# Patient Record
Sex: Male | Born: 1975 | Race: Black or African American | Hispanic: No | Marital: Married | Smoking: Current every day smoker
Health system: Southern US, Community
[De-identification: ages and names within clinical notes are randomized; demographics above are authoritative.]

## PROBLEM LIST (undated history)

## (undated) DIAGNOSIS — Z789 Other specified health status: Secondary | ICD-10-CM

---

## 2001-09-20 ENCOUNTER — Encounter: Payer: Self-pay | Admitting: Urology

## 2001-09-20 ENCOUNTER — Ambulatory Visit (HOSPITAL_COMMUNITY): Admission: RE | Admit: 2001-09-20 | Discharge: 2001-09-20 | Payer: Self-pay | Admitting: Urology

## 2006-04-19 HISTORY — PX: SHOULDER SURGERY: SHX246

## 2007-01-23 ENCOUNTER — Ambulatory Visit: Payer: Self-pay | Admitting: Orthopedic Surgery

## 2007-02-01 ENCOUNTER — Ambulatory Visit (HOSPITAL_COMMUNITY): Admission: RE | Admit: 2007-02-01 | Discharge: 2007-02-01 | Payer: Self-pay | Admitting: Orthopedic Surgery

## 2007-02-15 ENCOUNTER — Ambulatory Visit: Payer: Self-pay | Admitting: Orthopedic Surgery

## 2007-02-15 DIAGNOSIS — M24419 Recurrent dislocation, unspecified shoulder: Secondary | ICD-10-CM | POA: Insufficient documentation

## 2007-02-22 ENCOUNTER — Encounter: Payer: Self-pay | Admitting: Orthopedic Surgery

## 2007-03-13 ENCOUNTER — Ambulatory Visit: Payer: Self-pay | Admitting: Orthopedic Surgery

## 2007-03-24 ENCOUNTER — Emergency Department (HOSPITAL_COMMUNITY): Admission: EM | Admit: 2007-03-24 | Discharge: 2007-03-24 | Payer: Self-pay | Admitting: Emergency Medicine

## 2007-03-24 ENCOUNTER — Ambulatory Visit (HOSPITAL_COMMUNITY): Admission: RE | Admit: 2007-03-24 | Discharge: 2007-03-24 | Payer: Self-pay | Admitting: Orthopedic Surgery

## 2007-03-24 ENCOUNTER — Ambulatory Visit: Payer: Self-pay | Admitting: Orthopedic Surgery

## 2007-03-28 ENCOUNTER — Ambulatory Visit: Payer: Self-pay | Admitting: Orthopedic Surgery

## 2007-03-28 DIAGNOSIS — M24819 Other specific joint derangements of unspecified shoulder, not elsewhere classified: Secondary | ICD-10-CM | POA: Insufficient documentation

## 2007-04-05 ENCOUNTER — Ambulatory Visit: Payer: Self-pay | Admitting: Orthopedic Surgery

## 2007-05-15 ENCOUNTER — Encounter: Payer: Self-pay | Admitting: Orthopedic Surgery

## 2007-05-17 ENCOUNTER — Ambulatory Visit: Payer: Self-pay | Admitting: Orthopedic Surgery

## 2007-07-10 ENCOUNTER — Ambulatory Visit: Payer: Self-pay | Admitting: Orthopedic Surgery

## 2007-07-18 ENCOUNTER — Telehealth: Payer: Self-pay | Admitting: Orthopedic Surgery

## 2007-08-01 ENCOUNTER — Ambulatory Visit: Payer: Self-pay | Admitting: Orthopedic Surgery

## 2007-08-29 ENCOUNTER — Ambulatory Visit: Payer: Self-pay | Admitting: Orthopedic Surgery

## 2007-09-18 ENCOUNTER — Encounter: Payer: Self-pay | Admitting: Orthopedic Surgery

## 2007-09-25 ENCOUNTER — Encounter: Admission: RE | Admit: 2007-09-25 | Discharge: 2007-09-25 | Payer: Self-pay | Admitting: Orthopedic Surgery

## 2007-09-27 ENCOUNTER — Encounter: Payer: Self-pay | Admitting: Orthopedic Surgery

## 2007-10-03 ENCOUNTER — Ambulatory Visit: Payer: Self-pay | Admitting: Orthopedic Surgery

## 2007-12-15 ENCOUNTER — Encounter: Payer: Self-pay | Admitting: Orthopedic Surgery

## 2008-01-23 ENCOUNTER — Telehealth: Payer: Self-pay | Admitting: Orthopedic Surgery

## 2008-01-24 ENCOUNTER — Encounter: Payer: Self-pay | Admitting: Orthopedic Surgery

## 2008-02-13 ENCOUNTER — Telehealth: Payer: Self-pay | Admitting: Orthopedic Surgery

## 2008-02-15 ENCOUNTER — Encounter: Payer: Self-pay | Admitting: Orthopedic Surgery

## 2008-03-06 ENCOUNTER — Encounter: Payer: Self-pay | Admitting: Orthopedic Surgery

## 2008-04-05 ENCOUNTER — Encounter: Payer: Self-pay | Admitting: Orthopedic Surgery

## 2008-06-26 ENCOUNTER — Encounter: Payer: Self-pay | Admitting: Orthopedic Surgery

## 2009-02-23 IMAGING — CT CT EXTREM UP W/O CM*L*
2 of 3 series · 7 of 14 positions shown, 8 images · non-contrast
Comparison: 02/01/2007

CLINICAL DATA: Shoulder instability.  Hill-Sachs impaction and
Irimia tear.  Labral anchors.

CT OF THE LEFT SHOULDER WITHOUT CONTRAST
TECHNIQUE: Multidetector CT imaging of the left shoulder was
performed according to the standard protocol without intravenous
contrast. Multiplanar CT image reconstructions were also generated.

[Series 3: shoulder/standard · axial · 0.51mm/px · z∈[-168,-34]mm · 5 of 161 slices shown]
[im 27/161  soft-tissue]
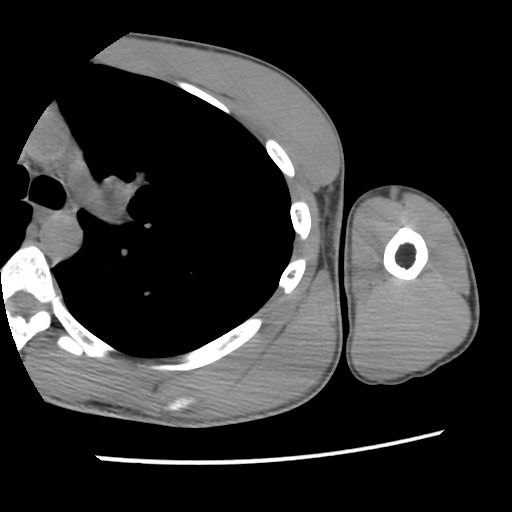
[im 54/161  soft-tissue]
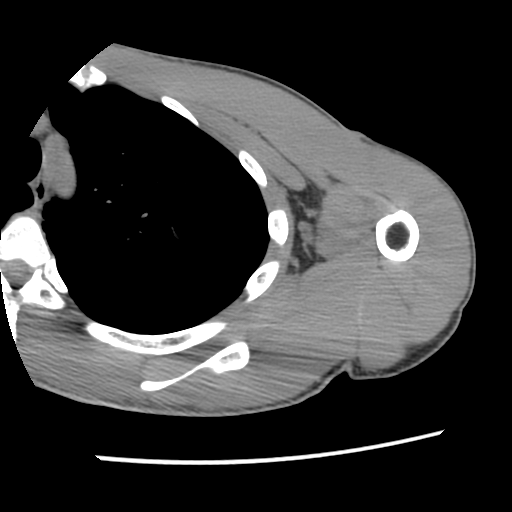
[im 81/161  soft-tissue]
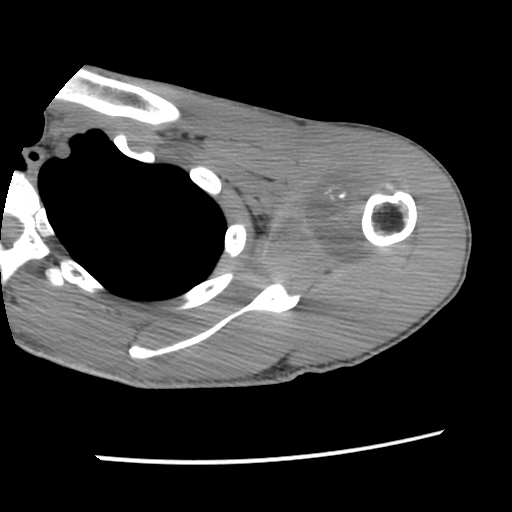
[im 107/161  soft-tissue]
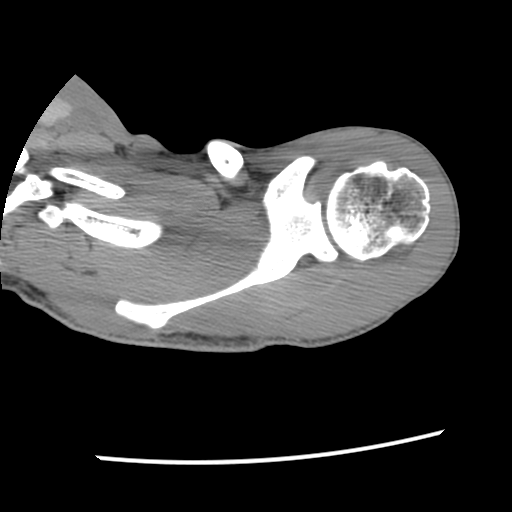
[im 134/161  soft-tissue]
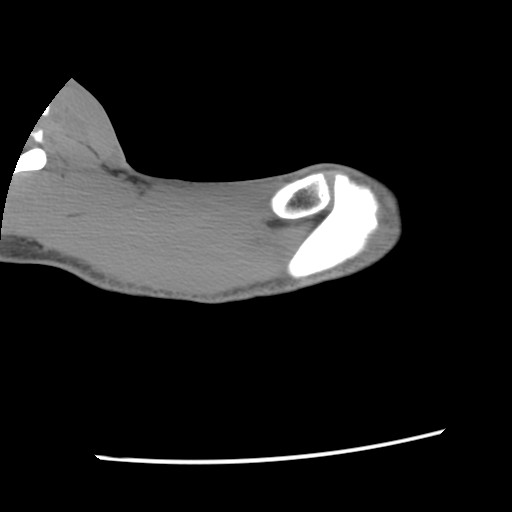

[Series 401: sag obl · sagittal · 0.51mm/px · 2 of 111 slices shown, 3 images]
[im 37/111  soft-tissue]
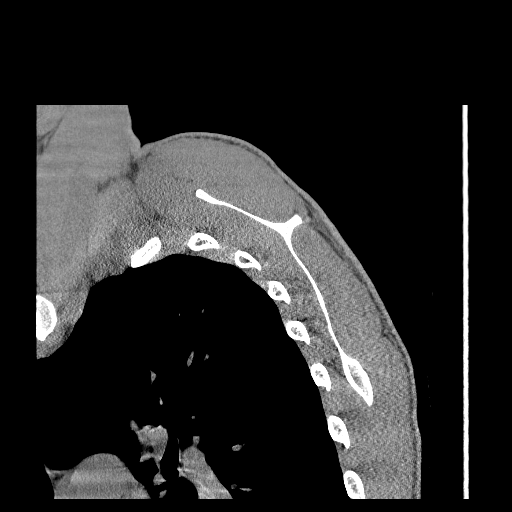
[im 37/111  bone]
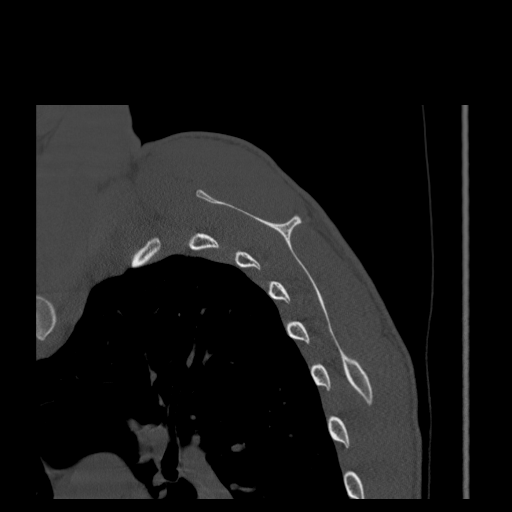
[im 74/111  bone]
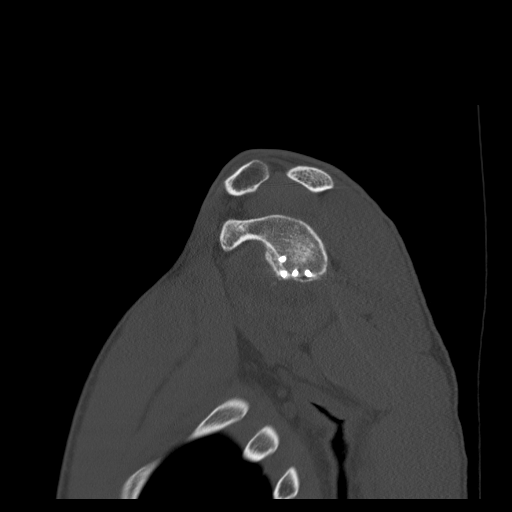

[7 of 14 positions shown; findings below may reference images not displayed]

FINDINGS: The anterior and anterior inferior bony labrum appear
irregular.  On the sagittal images such as images 70 through 77 of
series 401, this appears to represent a prior anterior glenoid
fracture with some evidence of healing response.  This healing
response has resulted in significant anterior spurring.

Four metallic anchors are noted within the anterior and anterior
inferior bony labrum.  In addition, there are several small linear
densities dependently within the shoulder joint effusion.  These do
not have the metallic density of the bony glenoid anchors, but
appear to have a density more comparable to that of cortical bone.
This could represent cortical bone fragments or calcified
bioabsorbable anchor fragments.

Additional small calcific densities are present within the shoulder
joint, and the possibility of synovial osteochondromatosis is not
excluded. There is a considerable sized Hill-Sachs impaction of the
posterior superior humeral head.  There are screw tracks from prior
bioabsorbable screws along the greater tuberosity.

The acromial undersurface is type 3 (hooked).
IMPRESSION: 1.  Prior anterior glenoid fracture, which appears healed, with
significant residual spurring.  There are metallic screw anchors in
the inferior and anterior-inferior glenoid. The anterior bony
glenoid is irregular.
2.  Several linear calcified fragments free in the shoulder joint
effusion are present.  There are also a smaller lace-like
calcifications along the margin of the joint effusion.  The
appearance could reflect synovial osteochondromatosis and/or
potentially calcified bioabsorbable loose screws.
3.  Prominent Hill-Sachs impaction of the humeral head posteriorly.
4.  Hooked subacromial morphology.

## 2009-06-10 ENCOUNTER — Encounter: Payer: Self-pay | Admitting: Orthopedic Surgery

## 2010-05-21 NOTE — Letter (Signed)
Summary: Medical record request [facility]  Medical record request [facility]   Imported By: Cammie Sickle 10/11/2009 13:25:43  _____________________________________________________________________  External Attachment:    Type:   Image     Comment:   External Document

## 2010-09-01 NOTE — Op Note (Signed)
NAMEMANLY, NESTLE NO.:  [id number]   MEDICAL RECORD NO.:  [id number]          PATIENT TYPE:  AMB   LOCATION:  DAY                           FACILITY:  [facility]   PHYSICIAN:  Vickki Hearing, M.D.DATE OF BIRTH:  [birth year]   DATE OF PROCEDURE:  03/24/2007  DATE OF DISCHARGE:                               OPERATIVE REPORT   PREOP DIAGNOSIS:  Recurrent dislocation left shoulder.   POSTOP DIAGNOSIS:  Recurrent dislocation left shoulder.   PROCEDURE:  1. Examination under anesthesia.  2. Bankart repair, left shoulder.   SURGEON:  Fuller Canada, MD assisted by  [place].   ANESTHETIC:  General.   OPERATIVE FINDINGS:  The shoulder had a grade 1 laxity in the anterior  plane, none in the posterior plane, none inferiorly under anesthesia.  Intraop evaluation revealed stripping of the anterior labrum, Hill-  Sach's impaction lesion is noted on MRI, no cuff tear was noted.   PROCEDURE AS FOLLOWS:  Firman Iwai identified preop, left shoulder  marked for surgery, countersigned by surgeon.  Patient went to surgery,  had general anesthetic, IV Ancef was given.  Sterile prep and drape was  performed.  Exam under anesthesia was done prior to sterile prep and  drape.  Findings are noted in the operative report above.   After draping was completed, a timeout procedure was done.   Subcu tissue was injected with Marcaine, epinephrine solution.  Incision  was made using the previous incision.  There was significant scarring.  Careful dissection was taken down to the deltopectoral interval.  The  cephalic vein was retracted laterally and preserved.  Deltopectoral  interval was entered.  The strap muscles were adhesed to the underlying  fascia.  This was dissected with peanuts and blunt and sharp dissection.  We took down the strap muscles on the lateral portion and tagged it.  We  externally rotated the arm, marked the biceps tendon with a suture.  The  interval  between the capsule and the subscapularis muscle were  indistinguishable.  I tried several methods to find this interval.  We  first tried a medial approach to lateral approach, could not separate  the capsule from the tendon, tried a superior inferior approach, could  not do it as well.   I, therefore, released the subscapularis and capsule in one layer,  reflected this medially and examined the joint.  We irrigated the joint.  There was a loss of a bumper effect of the anterior labrum and this was  treated with takedown of the capsule towards the scapula.  Then two  suture anchors were placed, one at 6 o'clock and one at 8 o'clock, and  then an inside to outside suture was used to recreate the bumper effect  on the glenoid.  We used two Arthrex corkscrew anchors with a 35  corkscrew size.  We then repaired this down and closed the subscap  capsular layer.  The inferior capsule was not redundant and there was no  pouch noted.   We repaired the subscap anatomically with interrupted #2 Ethibond  suture.  We then  repaired the strap muscle with a #1 Vicryl, injected  more epinephrine, Sensorcaine solution in the submuscular layers but not  in the joint.  We put in a pain pump catheter, #2-0 Monocryl and #3-0  Prolene and Steri-Strips to close the skin.   Pain pump catheter was activated.  Sterile bandage was applied with  sling.   POSTOP PLAN:  Because this was a revision he is going to stay in a sling  for 3 weeks.  Of note, he did have 25 degrees of external rotation at  the end of the procedure and a shuck test was performed to assure  stability of the shoulder anteriorly and this was stable.  He will be  discharged with pain medication, Phenergan, the pain pump catheter and a  followup will be scheduled for next week.      Vickki Hearing, M.D.  Electronically Signed     SEH/MEDQ  D:  03/24/2007  T:  03/24/2007  Job:  [id number]

## 2010-09-01 NOTE — H&P (Signed)
[name], [name]               ACCOUNT NO.:  [id number]   MEDICAL RECORD NO.:  [id number]          PATIENT TYPE:  AMB   LOCATION:  DAY                           FACILITY:  [facility]   PHYSICIAN:  Vickki Hearing, M.D.DATE OF BIRTH:  30-Jan-1976   DATE OF ADMISSION:  DATE OF DISCHARGE:  LH                              HISTORY & PHYSICAL   CHIEF COMPLAINT:  Instability of the left shoulder.   MEDICAL HISTORY:  Brett Mcintyre is now 35 years old.  He is status post an  anterior shoulder reconstruction on the left in 2001.  He did well for 7  years.  Approximately 2 weeks ago he fell.  His shoulder popped out of  place, he put it back in, it came out again, he went to the emergency  room.  On that occasion it was a subluxation.  He now complains of 6/10  constant, aching, sharp pain which is nonradiating, located over the  left shoulder joint.   He has undergone some physical therapy to improve his shoulder motion.  He is currently managed with 5 mg of Vicodin one every 4 hours p.r.n.  for pain.  He presents now for a shoulder reconstruction for the second  time.  He had an MRI done February 01, 2007, at [facility].  He  has a large Hill-Sachs impaction injury of the humeral head.  His  anterior labrum has been stripped and is most likely a periosteal sleeve  avulsion.  There is tearing of the posterior labrum.  There is an  articular surface tear of the infraspinatus-supraspinatus junction with  an intact biceps tendon.   He has no current allergies that there are known.  He has no other  surgeries, has a negative family and social history.   He has normal vital signs.  His appearance was he was well-developed and  -nourished, grooming and hygiene were normal, body habitus thin frame.  Skin was intact.  No scars, rashes or lesions.  There is a scar over the  left shoulder which was nontender and healed.  There was no swelling in  the shoulder.  His radial and ulnar pulses were  intact bilaterally.  He  had normal sensation, reflexes and strength.  His flexion in the left  shoulder was 150, abduction 120, external rotation 40 with pain.  He had  a positive apprehension test.  His posterior stress test was negative.  Impingement sign was negative.  He had no inferior subluxation.   IMPRESSION:  Recurrent shoulder dislocation left shoulder.   PLAN:  Scheduled for open Bankart with capsular shift left shoulder.  The patient understands the possibility of loss of external rotation.  Informed consent process was completed in the office.  The patient  understands that no guarantees for re-dislocation are made at this  point.      Vickki Hearing, M.D.  Electronically Signed    SEH/MEDQ  D:  03/23/2007  T:  03/23/2007  Job:  [id number]

## 2011-01-25 LAB — HEMOGLOBIN AND HEMATOCRIT, BLOOD
HCT: 42.5
Hemoglobin: 13.9

## 2011-06-04 ENCOUNTER — Encounter (INDEPENDENT_AMBULATORY_CARE_PROVIDER_SITE_OTHER): Payer: Self-pay | Admitting: General Surgery

## 2011-06-04 ENCOUNTER — Ambulatory Visit (INDEPENDENT_AMBULATORY_CARE_PROVIDER_SITE_OTHER): Payer: 59 | Admitting: General Surgery

## 2011-06-04 VITALS — BP 104/76 | HR 72 | Temp 97.8°F | Resp 16 | Ht 70.0 in | Wt 135.8 lb

## 2011-06-04 DIAGNOSIS — K645 Perianal venous thrombosis: Secondary | ICD-10-CM

## 2011-06-04 MED ORDER — LIDOCAINE HCL 2 % EX GEL: CUTANEOUS | Status: DC

## 2011-06-04 NOTE — Patient Instructions (Signed)
Continue with warm water tub soaks 3-4 times/day for 15-20 minutes at a time  Continue to try to have soft, daily bowel movements. Avoid straining. Avoid sitting on the toilet for a prolonged period of time

## 2011-06-04 NOTE — Progress Notes (Signed)
Subjective:     Patient ID: Brett Mcintyre, male   DOB: [birth year], 36 y.o.   MRN: [id number]  HPI 36 year old African American male sent by urgent care for evaluation of a thrombosed external hemorrhoid. He states that he started having itching 3 days ago with bowel movements. He then developed pain in his rectal region 2 days ago. He noticed an area that was swollen and tender. He states that it does hurt when he has a bowel movement only for a few minutes. He denies constipation or diarrhea. He denies any incontinence. He denies sitting and reading on the commode. He denies straining at defecation. He denies any recent melena or hematochezia. He did have an episode of hematochezia several months ago for which he underwent a colonoscopy. He states that a colonoscopy was negative. He denies any recent heavy lifting or strenuous activity.  PMH, PSH, ALL, MED, SOCH, FAMHx reviewed and documented in medical record  Review of Systems 10 point ROS was performed and all systems are negative except for what is mentioned in HPI    Objective:   Physical Exam BP 104/76  Pulse 72  Temp(Src) 97.8 F (36.6 C) (Temporal)  Resp 16  Ht 5\' 10"  (1.778 m)  Wt 135 lb 12.8 oz (61.598 kg)  BMI 19.49 kg/m2  Gen: alert, NAD, non-toxic appearing Pupils: equal, no scleral icterus Pulm: Lungs clear to auscultation, symmetric chest rise CV: regular rate and rhythm Abd: soft, nontender, nondistended. Ext: no edema,  Skin: no rash, no jaundice Rectal: small thrombosed right lateral ext hemorrhoid. No fissure. dre deferred     Assessment:     Thrombosed right lateral external hemorrhoid    Plan:     We discussed the etiology of hemorrhoids. The patient was given educational material.  We discussed nonoperative and operative management of external hemorrhoidal disease.  We then discussed excisional external hemorrhoidectomy in the office along with the risks and benefits including but not limited to  bleeding, infection, recurrence and postop course  Not sure why pt develop hemorrhoid. He has great bowel habits.   PLAN: the patient decided to continue with non-operative management. I think this is reasonable since his symptoms started 2.5 days ago. I explained that after 2 days I generally recommend non-operative management and not I&D or excising the area since generally the maximal pain has already occurred.   Rx given for lidocaine ointment F/u prn  Mary Sella. Andrey Campanile, MD, FACS General, Bariatric, & Minimally Invasive Surgery [facility], [place]

## 2012-01-12 ENCOUNTER — Encounter: Payer: Self-pay | Admitting: Orthopedic Surgery

## 2012-01-12 ENCOUNTER — Ambulatory Visit (INDEPENDENT_AMBULATORY_CARE_PROVIDER_SITE_OTHER): Payer: Self-pay | Admitting: Orthopedic Surgery

## 2012-01-12 VITALS — BP 108/60 | Ht 70.0 in | Wt 135.0 lb

## 2012-01-12 DIAGNOSIS — S62319A Displaced fracture of base of unspecified metacarpal bone, initial encounter for closed fracture: Secondary | ICD-10-CM

## 2012-01-12 MED ORDER — HYDROCODONE-ACETAMINOPHEN 10-325 MG PO TABS: 1.0000 | ORAL_TABLET | Freq: Four times a day (QID) | ORAL | Status: DC | PRN

## 2012-01-12 NOTE — Progress Notes (Signed)
Patient ID: Brett Mcintyre, male   DOB: March 28, 1976, 36 y.o.   MRN: [id number] Chief Complaint  Patient presents with  . Hand Injury    right hand fracture, DOI 01/05/12    The patient fell and injured his right hand he was [facility] x-rays show a base of the fifth metacarpal fracture.  Date of injury 01/05/2012  Mechanism fall  Symptoms include throbbing 7/10 constant pain over the fracture site relieved partially by Lortab 7.5 mg. Worsened by constant movement. Associated signs swelling  All systems reviewed all systems were normal by patient report  Past Medical History  Diagnosis Date  . Hemorrhoids    Past Surgical History  Procedure Date  . Shoulder surgery 2008    left    The patient's allergies are recorded, the medical and surgical history have been recorded, medications family history and social history have been recorded and all have been reviewed.  BP 108/60  Ht 5\' 10"  (1.778 m)  Wt 135 lb (61.236 kg)  BMI 19.37 kg/m2  Vital signs are stable as recorded  General appearance is normal  The patient is alert and oriented x3  The patient's mood and affect are normal  Gait assessment: Normal   The patient's right hand evaluation reveals swelling and tenderness over the base of the fifth metacarpal but there is no malalignment. He is painful in decreased range of motion at the metacarpophalangeal joints. No instability is detected. Muscle tone is normal skin is intact. As a good pulse and normal temperature there is some swelling. Epitrochlear lymph nodes are normal. He has normal sensation no pathologic reflexes.   Plain films are reviewed x-ray base of the fifth metacarpal no dorsal subluxation  Recommend DonJoy moldable split  X-ray in 6 weeks  Full-time wear splint 3 weeks  Out of work 6 weeks  Last date worked September 17 followup here in 6 weeks  Norco 10 mg one every 4 when necessary for pain #56 2 refills

## 2012-01-12 NOTE — Patient Instructions (Addendum)
Full time Splint x 3 weeks   OOW x 6 weeks

## 2012-01-19 ENCOUNTER — Encounter: Payer: Self-pay | Admitting: Orthopedic Surgery

## 2012-01-19 ENCOUNTER — Ambulatory Visit (INDEPENDENT_AMBULATORY_CARE_PROVIDER_SITE_OTHER): Payer: Self-pay | Admitting: Orthopedic Surgery

## 2012-01-19 VITALS — BP 90/60 | Ht 70.0 in | Wt 135.0 lb

## 2012-01-19 DIAGNOSIS — S62319A Displaced fracture of base of unspecified metacarpal bone, initial encounter for closed fracture: Secondary | ICD-10-CM

## 2012-01-19 NOTE — Progress Notes (Signed)
Patient ID: Brett Mcintyre, male   DOB: Aug 29, 1975, 36 y.o.   MRN: [id number] Chief Complaint  Patient presents with  . Follow-up    recheck small finger right hand    Fracture RIGHT hand 5th metacarpal  The skin somehow got macerated  Recommend Gold Bond powder and remove brace for bathing and shower.  Keep x-ray appointment.

## 2012-01-19 NOTE — Patient Instructions (Addendum)
Gold bond powder   Remove for bathing

## 2012-02-16 ENCOUNTER — Encounter: Payer: Self-pay | Admitting: Orthopedic Surgery

## 2012-02-16 ENCOUNTER — Ambulatory Visit (INDEPENDENT_AMBULATORY_CARE_PROVIDER_SITE_OTHER): Payer: Self-pay

## 2012-02-16 ENCOUNTER — Ambulatory Visit (INDEPENDENT_AMBULATORY_CARE_PROVIDER_SITE_OTHER): Payer: Self-pay | Admitting: Orthopedic Surgery

## 2012-02-16 VITALS — BP 90/60 | Ht 70.0 in | Wt 135.0 lb

## 2012-02-16 DIAGNOSIS — S6290XA Unspecified fracture of unspecified wrist and hand, initial encounter for closed fracture: Secondary | ICD-10-CM

## 2012-02-16 DIAGNOSIS — S62309A Unspecified fracture of unspecified metacarpal bone, initial encounter for closed fracture: Secondary | ICD-10-CM

## 2012-02-16 MED ORDER — HYDROCODONE-ACETAMINOPHEN 10-325 MG PO TABS: 1.0000 | ORAL_TABLET | Freq: Four times a day (QID) | ORAL | Status: DC | PRN

## 2012-02-16 NOTE — Patient Instructions (Addendum)
Use an exercise ball to regain strength   OT [facility]   OOW X 3 WEEKS

## 2012-02-16 NOTE — Progress Notes (Signed)
Patient ID: Brett Mcintyre, male   DOB: Jan 16, 1976, 36 y.o.   MRN: [id number] Chief Complaint  Patient presents with  . Follow-up    follow up and xray right hand, DOI 01/05/12    Fracture base of fifth metacarpal right hand  Six-week x-ray   x-ray shows fracture healing no displacement. The patient's range of motion at the metacarpophalangeal joint is limited. Passive range of motion is about 75. 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

## 2012-03-08 ENCOUNTER — Encounter: Payer: Self-pay | Admitting: Orthopedic Surgery

## 2012-03-08 ENCOUNTER — Ambulatory Visit (INDEPENDENT_AMBULATORY_CARE_PROVIDER_SITE_OTHER): Payer: 59 | Admitting: Orthopedic Surgery

## 2012-03-08 VITALS — Ht 70.0 in | Wt 135.0 lb

## 2012-03-08 DIAGNOSIS — S6290XA Unspecified fracture of unspecified wrist and hand, initial encounter for closed fracture: Secondary | ICD-10-CM

## 2012-03-08 DIAGNOSIS — S62319A Displaced fracture of base of unspecified metacarpal bone, initial encounter for closed fracture: Secondary | ICD-10-CM

## 2012-03-08 DIAGNOSIS — S62309A Unspecified fracture of unspecified metacarpal bone, initial encounter for closed fracture: Secondary | ICD-10-CM

## 2012-03-08 MED ORDER — HYDROCODONE-ACETAMINOPHEN 10-325 MG PO TABS: 1.0000 | ORAL_TABLET | Freq: Four times a day (QID) | ORAL | Status: DC | PRN

## 2012-03-08 NOTE — Patient Instructions (Signed)
Return to work on November 22nd

## 2012-03-08 NOTE — Progress Notes (Signed)
Patient ID: Brett Mcintyre, male   DOB: 07/18/1975, 36 y.o.   MRN: [id number] Chief Complaint  Patient presents with  . Follow-up    3 week recheck on right hand ROM.    1. Hand fracture  HYDROcodone-acetaminophen (NORCO) 10-325 MG per tablet  2. Fracture of metacarpal base, closed      Recheck right hand status post occupational therapy for residual swelling and pain and stiffness status post fifth metacarpal fracture  Full range of motion his been restored. He does have some weakness in grip. No rotatory deformity no angulatory deformity.  Continue therapy, return to work on November 20. Pain medication refilled plus one additional refill for a total of 102 tablets over 4 weeks

## 2013-02-14 ENCOUNTER — Ambulatory Visit: Payer: Self-pay | Admitting: Orthopedic Surgery

## 2016-01-26 ENCOUNTER — Encounter: Payer: Self-pay | Admitting: Orthopedic Surgery

## 2016-01-26 ENCOUNTER — Ambulatory Visit (INDEPENDENT_AMBULATORY_CARE_PROVIDER_SITE_OTHER): Payer: BLUE CROSS/BLUE SHIELD | Admitting: Orthopedic Surgery

## 2016-01-26 ENCOUNTER — Ambulatory Visit (INDEPENDENT_AMBULATORY_CARE_PROVIDER_SITE_OTHER): Payer: BLUE CROSS/BLUE SHIELD

## 2016-01-26 VITALS — BP 101/69 | HR 104 | Ht 67.0 in | Wt 142.0 lb

## 2016-01-26 DIAGNOSIS — M25312 Other instability, left shoulder: Secondary | ICD-10-CM

## 2016-01-26 DIAGNOSIS — M25512 Pain in left shoulder: Secondary | ICD-10-CM

## 2016-01-26 DIAGNOSIS — G8929 Other chronic pain: Secondary | ICD-10-CM

## 2016-01-26 MED ORDER — NABUMETONE 500 MG PO TABS: 500.0000 mg | ORAL_TABLET | Freq: Two times a day (BID) | ORAL | 0 refills | Status: DC

## 2016-01-26 NOTE — Progress Notes (Signed)
Chief Complaint  Patient presents with  . Shoulder Pain    left shoulder pain/ hx of surgeries in past    40 year old male status post LATERJET for recurrent shoulder dislocation on the left. Presents with 1-1/2 month history of pain left shoulder clicking which is constant aching dull associated with some giving way symptoms unrelieved by [facility] powders with no history of acute trauma.   Shoulder Pain   The pain is present in the left shoulder. This is a recurrent problem. The current episode started more than 1 month ago. There has been no history of extremity trauma. The problem occurs constantly. The problem has been gradually worsening. The quality of the pain is described as aching. The pain is at a severity of 6/10. Pertinent negatives include no numbness, stiffness or tingling. He has tried NSAIDS and OTC pain meds for the symptoms.    Review of Systems  Musculoskeletal: Negative for stiffness.  Neurological: Negative for tingling and numbness.  All other systems reviewed and are negative.   Past Medical History:  Diagnosis Date  . Hemorrhoids     Past Surgical History:  Procedure Laterality Date  . SHOULDER SURGERY  2008   left   Family History  Problem Relation Age of Onset  . Diabetes     Social History  Substance Use Topics  . Smoking status: Current Every Day Smoker    Packs/day: 1.00  . Smokeless tobacco: Never Used  . Alcohol use Yes     Comment: OCCASIONAL   No outpatient prescriptions have been marked as taking for the 01/26/16 encounter (Office Visit) with Carole Civil, MD.    BP 101/69   Pulse (!) 104   Ht 5\' 7"  (1.702 m)   Wt 142 lb (64.4 kg)   BMI 22.24 kg/m   Physical Exam  Constitutional: He is oriented to person, place, and time. He appears well-developed and well-nourished. No distress.  Cardiovascular: Normal rate and intact distal pulses.   Neurological: He is alert and oriented to person, place, and time.  Skin: Skin is warm and dry.  No rash noted. He is not diaphoretic. No erythema. No pallor.  Psychiatric: He has a normal mood and affect. His behavior is normal. Judgment and thought content normal.    Ortho Exam Right shoulder full range of motion no tenderness to palpation no swelling on inspection. Stable in abduction external rotation normal rotator cuff strength skin covering dry and intact pulses normal lymph nodes negative in the axilla and sensation is normal in the hand  Left shoulder anterior incision well-healed mild tenderness in the posterior joint line mild pain without apprehension in abduction external rotation without instability on load shift test. Full range of motion. Rotator cuff strength normal. Skin normal. Pulse and sensation normal and the left hand  ASSESSMENT: My personal interpretation of the images:  Today's x-ray shows large chronic Hill-Sachs lesion mild arthritis with inferior humeral spur. Hardware intact bone block intact  Diagnosis shoulder pain with chronic history of instability. Does not have acute instability    PLAN Recommend physical therapy Return in 4 weeks Relafen 500 mg twice a day  Arther Abbott, MD 01/26/2016 10:19 AM  .meds

## 2016-01-30 ENCOUNTER — Ambulatory Visit (HOSPITAL_COMMUNITY): Payer: BLUE CROSS/BLUE SHIELD | Attending: Orthopedic Surgery

## 2016-01-30 DIAGNOSIS — M25512 Pain in left shoulder: Secondary | ICD-10-CM | POA: Diagnosis not present

## 2016-01-30 DIAGNOSIS — R29898 Other symptoms and signs involving the musculoskeletal system: Secondary | ICD-10-CM

## 2016-01-30 DIAGNOSIS — G8929 Other chronic pain: Secondary | ICD-10-CM

## 2016-01-30 NOTE — Therapy (Addendum)
[facility] [address] [place], [place], [address] Phone: [phone]   Fax:  [phone]  Occupational Therapy Evaluation  Patient Details  Name: Brett Mcintyre MRN: [id number] Date of Birth: December 30, 1975 Referring Provider: Arther Abbott, MD  Encounter Date: 01/30/2016      OT End of Session - 01/30/16 1247    Visit Number 1   Number of Visits 3   Date for OT Re-Evaluation 02/29/16   Authorization Type BCBS    OT Start Time 0820   OT Stop Time 0900   OT Time Calculation (min) 40 min   Activity Tolerance Patient tolerated treatment well   Behavior During Therapy [facility] for tasks assessed/performed      Past Medical History:  Diagnosis Date  . Hemorrhoids     Past Surgical History:  Procedure Laterality Date  . SHOULDER SURGERY  2008   left    There were no vitals filed for this visit.      Subjective Assessment - 01/30/16 0823    Subjective  S: It doesn't hurt all the time. Only when I do certain movements.   Pertinent History Patient is a 40 y/o male S/P left shoulder instability and chronic pain which began 1-2 months ago. patient reports that he underwent a LaTERJET procedure in 2009 due to recurrent dislocation. Patient reports no known injury to his left shoulder recently although does remember that his pain began soon after doing 50 push ups. Dr. Aline Mcintyre has referred patient to occupational therapy for evaluation and treatment.   Special Tests FOTO score: 50/100   Patient Stated Goals To decrease pain level   Currently in Pain? 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 Pt reports pain with low external rotation when placing left arm in work jumpsuit (4/10), Completing abduction to 90 degree while picking up tool bag - 5-10# (10/10), and driving with left arm (6/10)     Mobility   Mobility Status Independent     Written Expression   Dominant Hand Right     Vision - History   Baseline Vision No visual deficits     Cognition   Overall Cognitive Status Within Functional Limits for tasks assessed     ROM / Strength   AROM / PROM / Strength AROM;PROM;Strength     Palpation   Palpation comment MIn fascial restrictions in left upper arm, trapezius, and scapularis region.     AROM   Overall AROM Comments Assessed seated. IR/er adducted   AROM Assessment Site Shoulder   Right/Left Shoulder Left   Left Shoulder Flexion 165 Degrees   Left Shoulder ABduction 180 Degrees   Left Shoulder  Internal Rotation 80 Degrees   Left Shoulder External Rotation 90 Degrees     PROM   Overall PROM  Within functional limits for tasks performed     Strength   Overall Strength Comments Assessed seated. IR/er adducted.   Strength Assessment Site Shoulder   Right/Left Shoulder Right   Right Shoulder Flexion 5/5   Right Shoulder ABduction 5/5   Right Shoulder Internal Rotation 5/5   Right Shoulder External Rotation 4+/5                         OT Education - 01/30/16 1245    Education provided Yes   Education Details Theraband strengthening for shoulder and scapula (green)   Person(s) Educated Patient   Methods Explanation;Demonstration;Verbal cues;Handout   Comprehension Verbalized understanding;Returned demonstration           OT Short Term Goals - 01/30/16 1255      OT SHORT TERM GOAL #1   Title Patient will be educated and independent with HEP to increase functional use of LUE during daily and work related tasks.    Time 3   Period Weeks   Status New     OT SHORT TERM GOAL #2   Title Patient will report decreased pain level in LUE when completing tasks such as driving, getting jumpsuit on, and picking up tool bag.   Time 3   Period Weeks   Status New     OT SHORT TERM GOAL #3   Title Patient will increase shoulder and scapular stability in LUE to increase strength reduce the occurance of pain.   Time 3   Period Weeks   Status New                  Plan - 01/30/16 1248    Clinical Impression Statement A: Patient is a 40 y/o male S/P left chronic shoulder pain and instability which is causing increased pain and fascial restrictions and decreased scapular/shoulder stability resulting in difficulty completing daily and work related tasks.   Rehab Potential Excellent   OT Frequency 1x / week   OT Duration Other (comment)  3 weeks   OT Treatment/Interventions Self-care/ADL training;Therapeutic exercise;Patient/family education;Manual Therapy;Ultrasound;Therapeutic activities;DME and/or AE instruction;Cryotherapy;Electrical Stimulation;Moist Heat;Passive range of motion   Plan P: Patient will benefit from skilled OT services to increase functional performance during daily tasks. Treatment Plan: Patient is to complete HEP independently while attending OT 1 time a week for 3 weeks (total of 2 treatment session). Myofascial release, scapular and shoulder stability exercises.       Patient will benefit from skilled therapeutic intervention in order to improve the following deficits and impairments:  Decreased strength, Pain, Increased fascial restricitons  Visit Diagnosis: Chronic left shoulder pain - Plan: Ot plan of care cert/re-cert  Other symptoms and signs involving the  musculoskeletal system - Plan: Ot plan of care cert/re-cert    Problem List Patient Active Problem List   Diagnosis Date Noted  . Fracture of metacarpal base, closed 01/12/2012  . SHOULDER INSTABILITY 03/28/2007  . SHOULDER DISLOCATION-RECURRENT 02/15/2007   Ailene Ravel, OTR/L,CBIS  [phone]  01/30/2016, 1:02 PM  [facility] [address] [place], [place], [address] Phone: [phone]   Fax:  [phone]  Name: Brett Mcintyre MRN: [id number] Date of Birth: [birth year]   OCCUPATIONAL THERAPY DISCHARGE SUMMARY  Visits from Start of Care: 1  Current functional level related to goals / functional outcomes: See above  Remaining deficits: All deficits remain that are listed above.   Education / Equipment: See above Plan: Patient agrees to discharge.  Patient goals were not met. Patient is being discharged due to not returning since the last visit.  ?????         Ailene Ravel, OTR/L,CBIS  [phone]

## 2016-01-30 NOTE — Patient Instructions (Signed)
Strengthening: Chest Pull - Resisted   Hold Theraband in front of body with hands about shoulder width a part. Pull band a part and back together slowly. Repeat _10-15___ times. Complete __1__ set(s) per session.. Repeat __2__ session(s) per day.  http://orth.exer.us/926   Copyright  VHI. All rights reserved.   PNF Strengthening: Resisted   Standing with resistive band around each hand, bring right arm up and away, thumb back. Repeat _10-15___ times per set. Do _1___ sets per session. Do 2____ sessions per day.            Resisted External Rotation: in Neutral - Bilateral   Sit or stand, tubing in both hands, elbows at sides, bent to 90, forearms forward. Pinch shoulder blades together and rotate forearms out. Keep elbows at sides. Repeat _10-15___ times per set. Do __1__ sets per session. Do __2__ sessions per day.  http://orth.exer.us/966   Copyright  VHI. All rights reserved.   PNF Strengthening: Resisted   Standing, hold resistive band above head. Bring right arm down and out from side. Repeat _10-15___ times per set. Do __1__ sets per session. Do __2__ sessions per day.  http://orth.exer.us/922   Copyright  VHI. All rights reserved.   Shoulder Flexion - Theraband  Place one end of the theraband under your foot and one in your hand.  Keeping elbow straight, raise arm straight out in front.  Complete 10-15 times. 1 set. 2 times a day.   SHOULDER FLEXION WITH THERABAND  While standing with back to the door, holding Theraband at hand level, raise arm in front of you.  Keep elbow straight through entire movement.  Complete 10-15 times. 1 set. 2 times a day.   (Home) Extension: Isometric / Bilateral Arm Retraction - Sitting   Facing anchor, hold hands and elbow at shoulder height, with elbow bent.  Pull arms back to squeeze shoulder blades together. Repeat 10-15 times.  Copyright  VHI. All rights reserved.   (Home) Retraction: Row - Bilateral  (Anchor)   Facing anchor, arms reaching forward, pull hands toward stomach, keeping elbows bent and at your sides and pinching shoulder blades together. Repeat 10-15 times.  Copyright  VHI. All rights reserved.   (Clinic) Extension / Flexion (Assist)   Face anchor, pull arms back, keeping elbow straight, and squeze shoulder blades together. Repeat 10-15 times.   Copyright  VHI. All rights reserved.

## 2016-02-06 ENCOUNTER — Ambulatory Visit (HOSPITAL_COMMUNITY): Payer: BLUE CROSS/BLUE SHIELD

## 2016-02-13 ENCOUNTER — Ambulatory Visit (HOSPITAL_COMMUNITY): Payer: BLUE CROSS/BLUE SHIELD

## 2016-02-24 ENCOUNTER — Ambulatory Visit: Payer: BLUE CROSS/BLUE SHIELD | Admitting: Orthopedic Surgery

## 2017-05-09 DIAGNOSIS — Z6820 Body mass index (BMI) 20.0-20.9, adult: Secondary | ICD-10-CM | POA: Diagnosis not present

## 2017-05-09 DIAGNOSIS — Z0000 Encounter for general adult medical examination without abnormal findings: Secondary | ICD-10-CM | POA: Diagnosis not present

## 2017-05-13 DIAGNOSIS — Z6820 Body mass index (BMI) 20.0-20.9, adult: Secondary | ICD-10-CM | POA: Diagnosis not present

## 2017-05-13 DIAGNOSIS — Z0000 Encounter for general adult medical examination without abnormal findings: Secondary | ICD-10-CM | POA: Diagnosis not present

## 2017-11-02 DIAGNOSIS — R251 Tremor, unspecified: Secondary | ICD-10-CM | POA: Diagnosis not present

## 2017-11-02 DIAGNOSIS — R51 Headache: Secondary | ICD-10-CM | POA: Diagnosis not present

## 2017-11-02 DIAGNOSIS — F17200 Nicotine dependence, unspecified, uncomplicated: Secondary | ICD-10-CM | POA: Diagnosis not present

## 2017-11-02 DIAGNOSIS — R479 Unspecified speech disturbances: Secondary | ICD-10-CM | POA: Diagnosis not present

## 2017-11-02 DIAGNOSIS — R55 Syncope and collapse: Secondary | ICD-10-CM | POA: Diagnosis not present

## 2017-11-04 DIAGNOSIS — Z711 Person with feared health complaint in whom no diagnosis is made: Secondary | ICD-10-CM | POA: Diagnosis not present

## 2017-11-04 DIAGNOSIS — Z0130 Encounter for examination of blood pressure without abnormal findings: Secondary | ICD-10-CM | POA: Diagnosis not present

## 2017-11-04 DIAGNOSIS — I959 Hypotension, unspecified: Secondary | ICD-10-CM | POA: Diagnosis not present

## 2017-11-04 DIAGNOSIS — F17200 Nicotine dependence, unspecified, uncomplicated: Secondary | ICD-10-CM | POA: Diagnosis not present

## 2017-12-30 DIAGNOSIS — M5442 Lumbago with sciatica, left side: Secondary | ICD-10-CM | POA: Diagnosis not present

## 2017-12-30 DIAGNOSIS — F17200 Nicotine dependence, unspecified, uncomplicated: Secondary | ICD-10-CM | POA: Diagnosis not present

## 2017-12-30 DIAGNOSIS — M545 Low back pain: Secondary | ICD-10-CM | POA: Diagnosis not present

## 2017-12-30 DIAGNOSIS — M4697 Unspecified inflammatory spondylopathy, lumbosacral region: Secondary | ICD-10-CM | POA: Diagnosis not present

## 2018-01-04 DIAGNOSIS — M62830 Muscle spasm of back: Secondary | ICD-10-CM | POA: Diagnosis not present

## 2018-01-04 DIAGNOSIS — Z6821 Body mass index (BMI) 21.0-21.9, adult: Secondary | ICD-10-CM | POA: Diagnosis not present

## 2018-05-18 DIAGNOSIS — J09X3 Influenza due to identified novel influenza A virus with gastrointestinal manifestations: Secondary | ICD-10-CM | POA: Diagnosis not present

## 2018-05-18 DIAGNOSIS — J40 Bronchitis, not specified as acute or chronic: Secondary | ICD-10-CM | POA: Diagnosis not present

## 2018-05-18 DIAGNOSIS — Z6820 Body mass index (BMI) 20.0-20.9, adult: Secondary | ICD-10-CM | POA: Diagnosis not present

## 2018-09-18 DIAGNOSIS — Z6820 Body mass index (BMI) 20.0-20.9, adult: Secondary | ICD-10-CM | POA: Diagnosis not present

## 2018-09-18 DIAGNOSIS — J40 Bronchitis, not specified as acute or chronic: Secondary | ICD-10-CM | POA: Diagnosis not present

## 2019-04-26 DIAGNOSIS — R05 Cough: Secondary | ICD-10-CM | POA: Diagnosis not present

## 2019-04-26 DIAGNOSIS — J029 Acute pharyngitis, unspecified: Secondary | ICD-10-CM | POA: Diagnosis not present

## 2019-04-26 DIAGNOSIS — Z20822 Contact with and (suspected) exposure to covid-19: Secondary | ICD-10-CM | POA: Diagnosis not present

## 2019-04-26 DIAGNOSIS — R52 Pain, unspecified: Secondary | ICD-10-CM | POA: Diagnosis not present

## 2019-04-30 DIAGNOSIS — R52 Pain, unspecified: Secondary | ICD-10-CM | POA: Diagnosis not present

## 2019-04-30 DIAGNOSIS — U071 COVID-19: Secondary | ICD-10-CM | POA: Diagnosis not present

## 2019-04-30 DIAGNOSIS — R6883 Chills (without fever): Secondary | ICD-10-CM | POA: Diagnosis not present

## 2019-04-30 DIAGNOSIS — Z20822 Contact with and (suspected) exposure to covid-19: Secondary | ICD-10-CM | POA: Diagnosis not present

## 2019-06-18 DIAGNOSIS — Z0000 Encounter for general adult medical examination without abnormal findings: Secondary | ICD-10-CM | POA: Diagnosis not present

## 2019-06-18 DIAGNOSIS — Z6820 Body mass index (BMI) 20.0-20.9, adult: Secondary | ICD-10-CM | POA: Diagnosis not present

## 2019-11-12 DIAGNOSIS — F10229 Alcohol dependence with intoxication, unspecified: Secondary | ICD-10-CM | POA: Diagnosis not present

## 2019-11-12 DIAGNOSIS — Z6820 Body mass index (BMI) 20.0-20.9, adult: Secondary | ICD-10-CM | POA: Diagnosis not present

## 2019-11-12 DIAGNOSIS — I10 Essential (primary) hypertension: Secondary | ICD-10-CM | POA: Diagnosis not present

## 2019-12-04 DIAGNOSIS — S93401A Sprain of unspecified ligament of right ankle, initial encounter: Secondary | ICD-10-CM | POA: Diagnosis not present

## 2019-12-04 DIAGNOSIS — Z6820 Body mass index (BMI) 20.0-20.9, adult: Secondary | ICD-10-CM | POA: Diagnosis not present

## 2019-12-16 DIAGNOSIS — M545 Low back pain: Secondary | ICD-10-CM | POA: Diagnosis not present

## 2020-05-13 DIAGNOSIS — Z20822 Contact with and (suspected) exposure to covid-19: Secondary | ICD-10-CM | POA: Diagnosis not present

## 2020-05-13 DIAGNOSIS — R42 Dizziness and giddiness: Secondary | ICD-10-CM | POA: Diagnosis not present

## 2020-05-13 DIAGNOSIS — R079 Chest pain, unspecified: Secondary | ICD-10-CM | POA: Diagnosis not present

## 2020-05-21 DIAGNOSIS — Z6821 Body mass index (BMI) 21.0-21.9, adult: Secondary | ICD-10-CM | POA: Diagnosis not present

## 2020-05-21 DIAGNOSIS — G4452 New daily persistent headache (NDPH): Secondary | ICD-10-CM | POA: Diagnosis not present

## 2021-02-02 ENCOUNTER — Encounter: Payer: Self-pay | Admitting: Internal Medicine

## 2021-02-17 ENCOUNTER — Encounter: Payer: Self-pay | Admitting: *Deleted

## 2021-02-17 ENCOUNTER — Other Ambulatory Visit: Payer: Self-pay

## 2021-02-17 ENCOUNTER — Ambulatory Visit (INDEPENDENT_AMBULATORY_CARE_PROVIDER_SITE_OTHER): Payer: BLUE CROSS/BLUE SHIELD | Admitting: *Deleted

## 2021-02-17 VITALS — Ht 70.0 in | Wt 149.0 lb

## 2021-02-17 DIAGNOSIS — Z1211 Encounter for screening for malignant neoplasm of colon: Secondary | ICD-10-CM

## 2021-02-17 MED ORDER — NA SULFATE-K SULFATE-MG SULF 17.5-3.13-1.6 GM/177ML PO SOLN: 1.0000 | Freq: Once | ORAL | 0 refills | Status: AC

## 2021-02-17 NOTE — Progress Notes (Addendum)
Gastroenterology Pre-Procedure Review  Request Date: 02/17/2021 Requesting Physician: Dr. Sherrie Sport, Last TCS 05/22/2010 by Dr. Rowe Pavy, normal colon  PATIENT REVIEW QUESTIONS: The patient responded to the following health history questions as indicated:    1. Diabetes Melitis: no 2. Joint replacements in the past 12 months: no 3. Major health problems in the past 3 months: no 4. Has an artificial valve or MVP: no 5. Has a defibrillator: no 6. Has been advised in past to take antibiotics in advance of a procedure like teeth cleaning: no 7. Family history of colon cancer: no 8. Alcohol Use: yes, 3 shots daily 9. Illicit drug Use: no 10. History of sleep apnea: no 11. History of coronary artery or other vascular stents placed within the last 12 months: no 12. History of any prior anesthesia complications: no 13. Body mass index is 21.38 kg/m.    MEDICATIONS & ALLERGIES:    Patient reports the following regarding taking any blood thinners:   Plavix? no Aspirin? no Coumadin? no Brilinta? no Xarelto? no Eliquis? no Pradaxa? no Savaysa? no Effient? no  Patient confirms/reports the following medications:  No current outpatient medications on file.   No current facility-administered medications for this visit.    Patient confirms/reports the following allergies:  No Known Allergies  No orders of the defined types were placed in this encounter.   AUTHORIZATION INFORMATION Primary Insurance: [employer],  [place] #: [id number],  Group #: [id number] Pre-Cert / Josem Kaufmann required: No, not required per Bernestine Amass / Auth #: REF#: [id number]  SCHEDULE INFORMATION: Procedure has been scheduled as follows:  Date: 03/10/2021, Time: 1:30  Location: [facility] with Dr. Abbey Chatters  This Gastroenterology Pre-Precedure Review Form is being routed to the following provider(s): Neil Crouch, PA-C

## 2021-02-18 NOTE — Progress Notes (Addendum)
Received colonoscopy report.  Last TCS done 05/22/2010 by Dr. Rowe Pavy with normal findings.  Updated triage accordingly and routing to Neil Crouch, PA-C.    Called pt.  He said that he thought procedure was done 2 to 3 years ago but must have been mistaken.  The last one he had was due to rectal bleeding.  Placed records in [place] office for review.

## 2021-02-18 NOTE — Progress Notes (Signed)
What is indication for colonoscopy now? Do we have record from colonoscopy 2-3 years ago? Not sure that he is due based on information provided.

## 2021-02-19 ENCOUNTER — Ambulatory Visit: Payer: BC Managed Care – PPO

## 2021-02-19 ENCOUNTER — Other Ambulatory Visit: Payer: Self-pay

## 2021-02-19 ENCOUNTER — Ambulatory Visit (INDEPENDENT_AMBULATORY_CARE_PROVIDER_SITE_OTHER): Payer: BC Managed Care – PPO | Admitting: Orthopedic Surgery

## 2021-02-19 ENCOUNTER — Encounter: Payer: Self-pay | Admitting: Orthopedic Surgery

## 2021-02-19 VITALS — BP 142/84 | HR 87 | Ht 70.0 in | Wt 147.0 lb

## 2021-02-19 DIAGNOSIS — M7712 Lateral epicondylitis, left elbow: Secondary | ICD-10-CM | POA: Diagnosis not present

## 2021-02-19 DIAGNOSIS — M25522 Pain in left elbow: Secondary | ICD-10-CM

## 2021-02-19 MED ORDER — PREDNISONE 10 MG PO TABS
10.0000 mg | ORAL_TABLET | Freq: Three times a day (TID) | ORAL | 0 refills | Status: DC
Start: 2021-02-19 — End: 2021-03-10

## 2021-02-19 NOTE — Progress Notes (Signed)
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    Appearance: He is well-developed.     Comments: Well developed, well nourished Normal grooming and hygiene     Cardiovascular:     Comments: No peripheral edema Musculoskeletal:     Left elbow: No swelling, deformity, effusion or lacerations. Normal range of motion. Tenderness present in lateral epicondyle.     Comments: + wr ext test   Elbow is stable   Skin:    General: Skin is warm and dry.  Neurological:     Mental Status: He is alert and oriented to person, place, and time.     Sensory: No sensory deficit.     Coordination: Coordination normal.     Gait: Gait normal.     Deep Tendon Reflexes: Reflexes are normal and symmetric.  Psychiatric:        Mood and Affect: Mood normal.        Behavior: Behavior normal.        Thought Content: Thought content normal.        Judgment: Judgment normal.     Comments: Affect normal     Xrays - neg   Encounter Diagnosis  Name Primary?   Pain in left elbow Yes    Nsaids Inject Wrist brace Exercises Cryotherapy Topical aspercreme   Procedure note injection for left tennis elbow  Diagnosis left tennis elbow  Anesthesia ethyl chloride was used Alcohol use is clean the skin  After we obtained verbal consent and  timeout a 25-gauge needle was used to inject 40 mg of Depo-Medrol and 3 cc of 1% lidocaine just distal to the insertion of the ECRB  There were no complications and a sterile bandage was applied.   Meds ordered this encounter  Medications   predniSONE (DELTASONE) 10 MG tablet    Sig: Take 1 tablet (10 mg total) by mouth 3 (three) times daily.    Dispense:  42 tablet    Refill:  0

## 2021-02-19 NOTE — Patient Instructions (Signed)
Apply ice 30 min end of the day   Wear wrist brace x 6 weeks  Take prednisone for 2 weeks   Do exercises daily   Apply aspercreme 3 x a day

## 2021-02-20 NOTE — Progress Notes (Signed)
Noted. OK to schedule. ASAII.

## 2021-02-26 ENCOUNTER — Other Ambulatory Visit: Payer: Self-pay | Admitting: *Deleted

## 2021-02-26 NOTE — Progress Notes (Signed)
Spoke to [employer] at [employer].  She informed me that pt can have colonoscopies starting at age 46.  REF#: [id number]

## 2021-03-10 ENCOUNTER — Ambulatory Visit (HOSPITAL_COMMUNITY): Payer: BC Managed Care – PPO | Admitting: Anesthesiology

## 2021-03-10 ENCOUNTER — Other Ambulatory Visit: Payer: Self-pay

## 2021-03-10 ENCOUNTER — Encounter (HOSPITAL_COMMUNITY): Payer: Self-pay

## 2021-03-10 ENCOUNTER — Encounter (HOSPITAL_COMMUNITY)
Admission: RE | Disposition: A | Discharge status: Home / Self Care | Payer: Self-pay | Source: Home / Self Care | Attending: Internal Medicine

## 2021-03-10 ENCOUNTER — Ambulatory Visit (HOSPITAL_COMMUNITY)
Admission: RE | Admit: 2021-03-10 | Discharge: 2021-03-10 | Disposition: A | Discharge status: Home / Self Care | Payer: BC Managed Care – PPO | Attending: Internal Medicine | Admitting: Internal Medicine

## 2021-03-10 DIAGNOSIS — D120 Benign neoplasm of cecum: Secondary | ICD-10-CM | POA: Diagnosis not present

## 2021-03-10 DIAGNOSIS — K648 Other hemorrhoids: Secondary | ICD-10-CM | POA: Diagnosis not present

## 2021-03-10 DIAGNOSIS — F17200 Nicotine dependence, unspecified, uncomplicated: Secondary | ICD-10-CM | POA: Diagnosis not present

## 2021-03-10 DIAGNOSIS — Z1211 Encounter for screening for malignant neoplasm of colon: Secondary | ICD-10-CM | POA: Insufficient documentation

## 2021-03-10 DIAGNOSIS — K635 Polyp of colon: Secondary | ICD-10-CM | POA: Diagnosis not present

## 2021-03-10 HISTORY — DX: Other specified health status: Z78.9

## 2021-03-10 HISTORY — PX: BIOPSY: SHX5522

## 2021-03-10 HISTORY — PX: COLONOSCOPY WITH PROPOFOL: SHX5780

## 2021-03-10 SURGERY — COLONOSCOPY WITH PROPOFOL
Anesthesia: General

## 2021-03-10 MED ORDER — PROPOFOL 10 MG/ML IV BOLUS
INTRAVENOUS | Status: DC | PRN
  Administered 2021-03-10: 100 mg via INTRAVENOUS

## 2021-03-10 MED ORDER — PROPOFOL 500 MG/50ML IV EMUL
INTRAVENOUS | Status: DC | PRN
  Administered 2021-03-10: 150 ug/kg/min via INTRAVENOUS

## 2021-03-10 MED ORDER — LACTATED RINGERS IV SOLN: INTRAVENOUS | Status: DC

## 2021-03-10 NOTE — Discharge Instructions (Addendum)
  Colonoscopy Discharge Instructions  Read the instructions outlined below and refer to this sheet in the next few weeks. These discharge instructions provide you with general information on caring for yourself after you leave the hospital. Your doctor may also give you specific instructions. While your treatment has been planned according to the most current medical practices available, unavoidable complications occasionally occur.   ACTIVITY You may resume your regular activity, but move at a slower pace for the next 24 hours.  Take frequent rest periods for the next 24 hours.  Walking will help get rid of the air and reduce the bloated feeling in your belly (abdomen).  No driving for 24 hours (because of the medicine (anesthesia) used during the test).   Do not sign any important legal documents or operate any machinery for 24 hours (because of the anesthesia used during the test).  NUTRITION Drink plenty of fluids.  You may resume your normal diet as instructed by your doctor.  Begin with a light meal and progress to your normal diet. Heavy or fried foods are harder to digest and may make you feel sick to your stomach (nauseated).  Avoid alcoholic beverages for 24 hours or as instructed.  MEDICATIONS You may resume your normal medications unless your doctor tells you otherwise.  WHAT YOU CAN EXPECT TODAY Some feelings of bloating in the abdomen.  Passage of more gas than usual.  Spotting of blood in your stool or on the toilet paper.  IF YOU HAD POLYPS REMOVED DURING THE COLONOSCOPY: No aspirin products for 7 days or as instructed.  No alcohol for 7 days or as instructed.  Eat a soft diet for the next 24 hours.  FINDING OUT THE RESULTS OF YOUR TEST Not all test results are available during your visit. If your test results are not back during the visit, make an appointment with your caregiver to find out the results. Do not assume everything is normal if you have not heard from your  caregiver or the medical facility. It is important for you to follow up on all of your test results.  SEEK IMMEDIATE MEDICAL ATTENTION IF: You have more than a spotting of blood in your stool.  Your belly is swollen (abdominal distention).  You are nauseated or vomiting.  You have a temperature over 101.  You have abdominal pain or discomfort that is severe or gets worse throughout the day.   Your colonoscopy revealed 1 polyp(s) which I removed successfully. Await pathology results, my office will contact you. I recommend repeating colonoscopy in 5-10 years for surveillance purposes depending on pathology results. Otherwise follow up with Gi as needed.    I hope you have a great rest of your week!  Brett Mcintyre. Abbey Chatters, D.O. Gastroenterology and Hepatology [facility]

## 2021-03-10 NOTE — Anesthesia Postprocedure Evaluation (Signed)
Anesthesia Post Note  Patient: Brett Mcintyre  Procedure(s) Performed: COLONOSCOPY WITH PROPOFOL BIOPSY  Patient location during evaluation: Endoscopy Anesthesia Type: General Anesthetic complications: no Comments: Nursing staff discharged the patient as per protocol.   No notable events documented.   Last Vitals:  Vitals:   03/10/21 1225 03/10/21 1334  BP: 113/76 91/62  Pulse: 87   Resp: 18 20  Temp: 37.1 C (!) 36.3 C  SpO2: 100% 98%    Last Pain:  Vitals:   03/10/21 1334  TempSrc: Oral  PainSc: 0-No pain                 [name] C Eudell Julian

## 2021-03-10 NOTE — Anesthesia Preprocedure Evaluation (Addendum)
Anesthesia Evaluation  Patient identified by MRN, date of birth, ID band Patient awake    Reviewed: Allergy & Precautions, H&P , NPO status , Patient's Chart, lab work & pertinent test results  Airway Mallampati: II  TM Distance: >3 FB Neck ROM: Full    Dental  (+) Dental Advisory Given, Teeth Intact   Pulmonary Current SmokerPatient did not abstain from smoking.,    Pulmonary exam normal breath sounds clear to auscultation       Cardiovascular negative cardio ROS Normal cardiovascular exam Rhythm:Regular Rate:Normal     Neuro/Psych negative neurological ROS  negative psych ROS   GI/Hepatic negative GI ROS, (+)     substance abuse  alcohol use,   Endo/Other  negative endocrine ROS  Renal/GU negative Renal ROS  negative genitourinary   Musculoskeletal negative musculoskeletal ROS (+)   Abdominal   Peds negative pediatric ROS (+)  Hematology negative hematology ROS (+)   Anesthesia Other Findings   Reproductive/Obstetrics negative OB ROS                            Anesthesia Physical Anesthesia Plan  ASA: 2  Anesthesia Plan: General   Post-op Pain Management: Minimal or no pain anticipated   Induction: Intravenous  PONV Risk Score and Plan: TIVA  Airway Management Planned: Nasal Cannula, Natural Airway and Simple Face Mask  Additional Equipment:   Intra-op Plan:   Post-operative Plan:   Informed Consent: I have reviewed the patients History and Physical, chart, labs and discussed the procedure including the risks, benefits and alternatives for the proposed anesthesia with the patient or authorized representative who has indicated his/her understanding and acceptance.     Dental advisory given  Plan Discussed with: CRNA and Surgeon  Anesthesia Plan Comments:        Anesthesia Quick Evaluation

## 2021-03-10 NOTE — Transfer of Care (Signed)
Immediate Anesthesia Transfer of Care Note  Patient: Brett Mcintyre  Procedure(s) Performed: COLONOSCOPY WITH PROPOFOL BIOPSY  Patient Location: PACU  Anesthesia Type:General  Level of Consciousness: awake, alert , oriented and patient cooperative  Airway & Oxygen Therapy: Patient Spontanous Breathing  Post-op Assessment: Report given to RN, Post -op Vital signs reviewed and stable and Patient moving all extremities X 4  Post vital signs: Reviewed and stable  Last Vitals:  Vitals Value Taken Time  BP    Temp    Pulse    Resp    SpO2      Last Pain:  Vitals:   03/10/21 1318  TempSrc:   PainSc: 0-No pain      Patients Stated Pain Goal: 6 (41/79/19 9579)  Complications: No notable events documented.

## 2021-03-10 NOTE — H&P (Signed)
Primary Care Physician:  Neale Burly, MD Primary Gastroenterologist:  Dr. Abbey Chatters  Pre-Procedure History & Physical: HPI:  Brett Mcintyre is a 45 y.o. male is here for a colonoscopy for colon cancer screening purposes.  Patient denies any family history of colorectal cancer.  No melena or hematochezia.  No abdominal pain or unintentional weight loss.  No change in bowel habits.  Overall feels well from a GI standpoint.  Past Medical History:  Diagnosis Date   Hemorrhoids    Medical history non-contributory     Past Surgical History:  Procedure Laterality Date   SHOULDER SURGERY  2008   left    Prior to Admission medications   Medication Sig Start Date End Date Taking? Authorizing Provider  SUPREP BOWEL PREP KIT 17.5-3.13-1.6 GM/177ML SOLN Take 354 mLs by mouth as directed. 02/17/21  Yes [provider]  predniSONE (DELTASONE) 10 MG tablet Take 1 tablet (10 mg total) by mouth 3 (three) times daily. Patient not taking: Reported on 03/04/2021 02/19/21   Carole Civil, MD    Allergies as of 02/23/2021   (No Known Allergies)    Family History  Problem Relation Age of Onset   Diabetes Other    Colon cancer Neg Hx     Social History   Socioeconomic History   Marital status: Married    Spouse name: Not on file   Number of children: Not on file   Years of education: 13   Highest education level: Not on file  Occupational History    Employer: [employer]  Tobacco Use   Smoking status: Every Day    Packs/day: 1.00    Types: Cigarettes   Smokeless tobacco: Never  Vaping Use   Vaping Use: Never used  Substance and Sexual Activity   Alcohol use: Yes    Comment: OCCASIONAL   Drug use: No   Sexual activity: Not on file  Other Topics Concern   Not on file  Social History Narrative   Not on file   Social Determinants of Health   Financial Resource Strain: Not on file  Food Insecurity: Not on file  Transportation Needs: Not on file  Physical  Activity: Not on file  Stress: Not on file  Social Connections: Not on file  Intimate Partner Violence: Not on file    Review of Systems: See HPI, otherwise negative ROS  Physical Exam: Vital signs in last 24 hours: Temp:  [98.7 F (37.1 C)] 98.7 F (37.1 C) (11/22 1225) Pulse Rate:  [87] 87 (11/22 1225) Resp:  [18] 18 (11/22 1225) BP: (113)/(76) 113/76 (11/22 1225) SpO2:  [100 %] 100 % (11/22 1225) Weight:  [63.5 kg] 63.5 kg (11/22 1225)   General:   Alert,  Well-developed, well-nourished, pleasant and cooperative in NAD Head:  Normocephalic and atraumatic. Eyes:  Sclera clear, no icterus.   Conjunctiva pink. Ears:  Normal auditory acuity. Nose:  No deformity, discharge,  or lesions. Mouth:  No deformity or lesions, dentition normal. Neck:  Supple; no masses or thyromegaly. Lungs:  Clear throughout to auscultation.   No wheezes, crackles, or rhonchi. No acute distress. Heart:  Regular rate and rhythm; no murmurs, clicks, rubs,  or gallops. Abdomen:  Soft, nontender and nondistended. No masses, hepatosplenomegaly or hernias noted. Normal bowel sounds, without guarding, and without rebound.   Msk:  Symmetrical without gross deformities. Normal posture. Extremities:  Without clubbing or edema. Neurologic:  Alert and  oriented x4;  grossly normal neurologically. Skin:  Intact without  significant lesions or rashes. Cervical Nodes:  No significant cervical adenopathy. Psych:  Alert and cooperative. Normal mood and affect.  Impression/Plan: Brett Mcintyre is here for a colonoscopy to be performed for colon cancer screening purposes.  The risks of the procedure including infection, bleed, or perforation as well as benefits, limitations, alternatives and imponderables have been reviewed with the patient. Questions have been answered. All parties agreeable.

## 2021-03-10 NOTE — Op Note (Signed)
[facility] Patient Name: Brett Mcintyre Procedure Date: 03/10/2021 1:08 PM MRN: [id number] Date of Birth: Jan 26, 1976 Attending MD: Elon Alas. Abbey Chatters DO CSN: [id number] Age: 45 Admit Type: Outpatient Procedure:                Colonoscopy Indications:              Screening for colorectal malignant neoplasm Providers:                Elon Alas. Abbey Chatters, DO, Janeece Riggers, RN, Nelma Rothman,                            Technician Referring MD:              Medicines:                See the Anesthesia note for documentation of the                            administered medications Complications:            No immediate complications. Estimated Blood Loss:     Estimated blood loss was minimal. Procedure:                Pre-Anesthesia Assessment:                           - The anesthesia plan was to use monitored                            anesthesia care (MAC).                           After obtaining informed consent, the colonoscope                            was passed under direct vision. Throughout the                            procedure, the patient's blood pressure, pulse, and                            oxygen saturations were monitored continuously. The                            PCF-HQ190L ([id number]) scope was introduced through                            the anus and advanced to the the cecum, identified                            by appendiceal orifice and ileocecal valve. The                            colonoscopy was performed without difficulty. The                            patient tolerated the procedure well. The  quality                            of the bowel preparation was evaluated using the                            BBPS [facility] Bowel Preparation Scale) with scores                            of: Right Colon = 3, Transverse Colon = 3 and Left                            Colon = 3 (entire mucosa seen well with no residual                            staining, small  fragments of stool or opaque                            liquid). The total BBPS score equals 9. Scope In: 1:18:33 PM Scope Out: 1:31:12 PM Scope Withdrawal Time: 0 hours 8 minutes 54 seconds  Total Procedure Duration: 0 hours 12 minutes 39 seconds  Findings:      The perianal and digital rectal examinations were normal.      Non-bleeding internal hemorrhoids were found during endoscopy.      A 2 mm polyp was found in the cecum. The polyp was sessile. The polyp       was removed with a cold biopsy forceps. Resection and retrieval were       complete.      The exam was otherwise without abnormality. Impression:               - Non-bleeding internal hemorrhoids.                           - One 2 mm polyp in the cecum, removed with a cold                            biopsy forceps. Resected and retrieved.                           - The examination was otherwise normal. Moderate Sedation:      Per Anesthesia Care Recommendation:           - Patient has a contact number available for                            emergencies. The signs and symptoms of potential                            delayed complications were discussed with the                            patient. Return to normal activities tomorrow.  Written discharge instructions were provided to the                            patient.                           - Resume previous diet.                           - Continue present medications.                           - Await pathology results.                           - Repeat colonoscopy in 5-10 years for surveillance.                           - Return to GI clinic PRN. Procedure Code(s):        --- Professional ---                           [phone], Colonoscopy, flexible; with biopsy, single                            or multiple Diagnosis Code(s):        --- Professional ---                           Z12.11, Encounter for screening for malignant                             neoplasm of colon                           K63.5, Polyp of colon                           K64.8, Other hemorrhoids CPT copyright 2019 [employer]. All rights reserved. The codes documented in this report are preliminary and upon coder review may  be revised to meet current compliance requirements. Elon Alas. Abbey Chatters, DO [place] Abbey Chatters, DO 03/10/2021 1:34:43 PM This report has been signed electronically. Number of Addenda: 0

## 2021-03-11 LAB — SURGICAL PATHOLOGY

## 2021-03-16 ENCOUNTER — Encounter (HOSPITAL_COMMUNITY): Payer: Self-pay | Admitting: Internal Medicine

## 2021-05-11 ENCOUNTER — Encounter: Payer: Self-pay | Admitting: Orthopedic Surgery

## 2021-05-11 ENCOUNTER — Ambulatory Visit: Payer: BC Managed Care – PPO | Admitting: Orthopedic Surgery

## 2021-05-11 ENCOUNTER — Other Ambulatory Visit: Payer: Self-pay

## 2021-05-11 DIAGNOSIS — M7711 Lateral epicondylitis, right elbow: Secondary | ICD-10-CM

## 2021-05-11 MED ORDER — PREDNISONE 10 MG (48) PO TBPK
ORAL_TABLET | Freq: Every day | ORAL | 0 refills | Status: DC
Start: 2021-05-11 — End: 2023-08-10

## 2021-05-11 NOTE — Progress Notes (Signed)
EVALUATION AND MANAGEMENT   Type of appointment : New problem  PLAN:  Right elbow injection Tennis elbow exercises Wrist extension block brace Prednisone Follow-up as needed  Procedure note injection for right tennis elbow   Diagnosis right tennis elbow  Anesthesia ethyl chloride was used Alcohol use is clean the skin  After we obtained verbal consent and timeout a 25-gauge needle was used to inject 40 mg of Depo-Medrol and 3 cc of 1% lidocaine just distal to the insertion of the ECRB  There were no complications and a sterile bandage was applied.   Meds ordered this encounter  Medications   predniSONE (STERAPRED UNI-PAK 48 TAB) 10 MG (48) TBPK tablet    Sig: Take by mouth daily.    Dispense:  48 tablet    Refill:  0     Chief Complaint  Patient presents with   Elbow Pain    RT Painful x 1 week    Brett Mcintyre was doing some work around the house around 14 January started having right elbow pain.  Brett Mcintyre thinks it similar to what Brett Mcintyre had on the left side which responded well to an injection and bracing  Brett Mcintyre did try to wear a brace on the elbow but it did not seem to help a presents now with lateral elbow pain  Focused right elbow exam Tenderness over the lateral epicondyle Skin is intact Range of motion is normal Elbow is stable Motor exam is normal with no atrophy Provocative testing: Positive wrist extension test against resistance pain over the lateral epicondyle   Review of Systems  Neurological:  Negative for tingling.    There is no height or weight on file to calculate BMI.  Physical Exam Constitutional:      General: Brett Mcintyre is not in acute distress.    Appearance: Brett Mcintyre is well-developed.     Comments: Well developed, well nourished Normal grooming and hygiene     Cardiovascular:     Comments: No peripheral edema Skin:    General: Skin is warm and dry.  Neurological:     Mental Status: Brett Mcintyre is alert and oriented to person, place, and time.     Sensory:  No sensory deficit.     Coordination: Coordination normal.     Gait: Gait normal.     Deep Tendon Reflexes: Reflexes are normal and symmetric.  Psychiatric:        Mood and Affect: Mood normal.        Behavior: Behavior normal.        Thought Content: Thought content normal.        Judgment: Judgment normal.     Comments: Affect normal     Past Medical History:  Diagnosis Date   Hemorrhoids    Medical history non-contributory    Past Surgical History:  Procedure Laterality Date   BIOPSY  03/10/2021   Procedure: BIOPSY;  Surgeon: Eloise Harman, DO;  [facility];  Service: Endoscopy;;   COLONOSCOPY WITH PROPOFOL N/A 03/10/2021   Procedure: COLONOSCOPY WITH PROPOFOL;  Surgeon: Eloise Harman, DO;  Location: [facility];  Service: Endoscopy;  Laterality: N/A;  1:30 / ASA II   SHOULDER SURGERY  2008   left   Social History   Tobacco Use   Smoking status: Every Day    Packs/day: 1.00    Types: Cigarettes   Smokeless tobacco: Never  Vaping Use   Vaping Use: Never used  Substance Use Topics   Alcohol use:  Yes    Comment: OCCASIONAL   Drug use: No     Assessment and Plan:  Encounter Diagnosis  Name Primary?   Lateral epicondylitis of right elbow Yes    Follow-up as needed  Meds ordered this encounter  Medications   predniSONE (STERAPRED UNI-PAK 48 TAB) 10 MG (48) TBPK tablet    Sig: Take by mouth daily.    Dispense:  48 tablet    Refill:  0

## 2021-05-11 NOTE — Patient Instructions (Addendum)
ICE ELBOW 2 X A DAY FOR 20 MIN   WEAR BRACE FOR 6 WEEKS   DO EXERCISES FOR 6 WEEKS   TAKE MEDICATION FOR 12 DAYS   Meds ordered this encounter  Medications   predniSONE (STERAPRED UNI-PAK 48 TAB) 10 MG (48) TBPK tablet    Sig: Take by mouth daily.    Dispense:  48 tablet    Refill:  0

## 2023-03-15 ENCOUNTER — Telehealth: Payer: Self-pay | Admitting: Orthopedic Surgery

## 2023-03-15 NOTE — Telephone Encounter (Signed)
Patient came in the office stating he was at a friend's place a hotel and  cleaning  his gutters out.  he fell from a roof when the ladder gave way.  He wants to do self pay.    I asked him what if he has to have surgery he said then he will file his insurance.  I told him I do not think we can do it like that.....    He just wants to make an appt to see Dr. Romeo Apple.  He states he went to [facility] in [place], I advised him,  he will need to go and get the office notes and CD of the xray and the report before we can see him, and he states this happened in September! He wants to be seen for his wrist and shoulder. [facility] told him it was a bad wrist sprain that is it.  He said it is still hurting and want's to see Dr. Romeo Apple.   I asked him why didn't he come to [place] and be seen by a [facility] doctor.  He said he lives in [place], that was closer. I also told him we can not make an appointment until we have all the information in hand before we can schedule him, and find out if this is workers comp or is he going to file this under his insurance.      Marland Kitchen  I

## 2023-08-04 ENCOUNTER — Encounter: Admitting: Orthopedic Surgery

## 2023-08-12 ENCOUNTER — Other Ambulatory Visit: Payer: Self-pay

## 2023-08-12 ENCOUNTER — Ambulatory Visit (INDEPENDENT_AMBULATORY_CARE_PROVIDER_SITE_OTHER): Payer: Self-pay | Admitting: Orthopedic Surgery

## 2023-08-12 ENCOUNTER — Other Ambulatory Visit (INDEPENDENT_AMBULATORY_CARE_PROVIDER_SITE_OTHER): Payer: Self-pay

## 2023-08-12 ENCOUNTER — Encounter: Payer: Self-pay | Admitting: Orthopedic Surgery

## 2023-08-12 VITALS — BP 147/102 | HR 81 | Ht 70.0 in | Wt 150.0 lb

## 2023-08-12 DIAGNOSIS — M79641 Pain in right hand: Secondary | ICD-10-CM

## 2023-08-12 DIAGNOSIS — M79642 Pain in left hand: Secondary | ICD-10-CM

## 2023-08-12 DIAGNOSIS — M069 Rheumatoid arthritis, unspecified: Secondary | ICD-10-CM

## 2023-08-12 MED ORDER — INDOMETHACIN 25 MG PO CAPS: 25.0000 mg | ORAL_CAPSULE | Freq: Three times a day (TID) | ORAL | 2 refills | Status: AC

## 2023-08-12 NOTE — Progress Notes (Signed)
  Intake history:  BP (!) 147/102   Pulse 81   Ht 5\' 10"  (1.778 m)   Wt 150 lb (68 kg)   BMI 21.52 kg/m  Body mass index is 21.52 kg/m.    WHAT ARE WE SEEING YOU FOR TODAY?   bilateral hand(s)  How long has this bothered you? (DOI?DOS?WS?)  2 month(s) ago  Anticoag.  No  Diabetes No  Heart disease No  Hypertension No  SMOKING HX No  Kidney disease No  Any ALLERGIES ______________________________________________   Treatment:  Have you taken:  Tylenol  No  Advil No  Had PT No  Had injection No  Other  ________________Methotrexate, steroid taper, patient states Methotrexate not helping _________

## 2023-08-12 NOTE — Progress Notes (Signed)
 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  Past Medical History:  Diagnosis Date   Hemorrhoids    Medical history non-contributory    Past Surgical History:  Procedure Laterality Date   BIOPSY  03/10/2021   Procedure: BIOPSY;  Surgeon: Vinetta Greening, DO;  [facility];  Service: Endoscopy;;   COLONOSCOPY WITH PROPOFOL  N/A 03/10/2021   Procedure: COLONOSCOPY WITH PROPOFOL ;  Surgeon: Vinetta Greening, DO;  Location: [facility];  Service: Endoscopy;  Laterality: N/A;  1:30 / ASA II   SHOULDER SURGERY  2008   left   Current Outpatient Medications  Medication Instructions   folic acid (FOLVITE) 1 mg, Daily   indomethacin (INDOCIN) 25 mg, Oral, 3 times daily with meals   methotrexate (RHEUMATREX) 15 mg, Weekly   No Known Allergies BP (!) 147/102   Pulse 81   Ht 5\' 10"  (1.778 m)   Wt 150 lb (68 kg)   BMI 21.52 kg/m   Physical Exam Vitals and nursing note reviewed. Exam conducted with a chaperone present.  Constitutional:      General: He is not in acute distress.    Appearance: Normal appearance. He is normal weight. He is not ill-appearing, toxic-appearing or diaphoretic.  HENT:     Head: Normocephalic and atraumatic.  Eyes:     General: No scleral icterus.       Right eye: No discharge.        Left eye: No discharge.     Extraocular Movements: Extraocular movements intact.     Pupils: Pupils are equal, round, and reactive to light.  Cardiovascular:     Rate and Rhythm: Normal rate.     Pulses: Normal pulses.  Pulmonary:      Effort: Pulmonary effort is normal.  Musculoskeletal:     Comments: Both hands are swollen and tender and warm to touch and can almost make a fist but not fully.  He has decreased range of motion in both wrists  Skin:    General: Skin is warm and dry.     Capillary Refill: Capillary refill takes less than 2 seconds.  Neurological:     General: No focal deficit present.     Mental Status: He is alert and oriented to person, place, and time.     Sensory: No sensory deficit.     Gait: Gait normal.  Psychiatric:        Mood and Affect: Mood normal.    DG Hand Complete Left Result Date: 08/12/2023 X-ray report Chief complaint hand pain and swelling Images 3v Reading: normal joints and no erosions Impression: normal hand xrays   DG Hand Complete Right Result Date: 08/12/2023 X-ray report Chief complaint pain swelling hands Images 3 v hand Reading: normal joints no erosions Impression: normal xrays    Assessment and plan recently diagnosed with rheumatoid arthritis  Complains of severe pain  He is on methotrexate and folic acid Recommend  Indocin  See rheumatology  Meds ordered this encounter  Medications  indomethacin (INDOCIN) 25 MG capsule    Sig: Take 1 capsule (25 mg total) by mouth 3 (three) times daily with meals.    Dispense:  60 capsule    Refill:  2
